# Patient Record
Sex: Male | Born: 1960 | Hispanic: Yes | Marital: Married | Smoking: Current every day smoker
Health system: Southern US, Community
[De-identification: ages and names within clinical notes are randomized; demographics above are authoritative.]

## PROBLEM LIST (undated history)

## (undated) DIAGNOSIS — Z789 Other specified health status: Secondary | ICD-10-CM

## (undated) HISTORY — PX: NO PAST SURGERIES: SHX2092

---

## 2013-09-05 ENCOUNTER — Emergency Department: Payer: Self-pay | Admitting: Emergency Medicine

## 2013-12-31 ENCOUNTER — Emergency Department: Payer: Self-pay | Admitting: Emergency Medicine

## 2014-05-27 ENCOUNTER — Other Ambulatory Visit: Payer: Self-pay | Admitting: Orthopedic Surgery

## 2014-05-28 ENCOUNTER — Encounter (HOSPITAL_BASED_OUTPATIENT_CLINIC_OR_DEPARTMENT_OTHER): Payer: Self-pay | Admitting: *Deleted

## 2014-05-28 NOTE — H&P (Signed)
Scott Wu is an 54 y.o. male.    Chief Complaint: Left Knee Pain  HPI: Patient is here today for followup on left knee pain.  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  He was last seen on 04/01/14 at which time he received a cortisone injection.  Patient states the cortisone injection did help initially but over the first couple hours.  He did not receive any substantial relief from the injection.  He currently rates his pain as an 8/10.  He has noticed continued catching popping clicking in the knee.    Past Medical History  Diagnosis Date  . Medical history non-contributory     Past Surgical History  Procedure Laterality Date  . No past surgeries      History reviewed. No pertinent family history. Social History:  reports that he has been smoking.  He does not have any smokeless tobacco history on file. He reports that he drinks alcohol. He reports that he does not use illicit drugs.  Allergies: No Known Allergies  No prescriptions prior to admission    No results found for this or any previous visit (from the past 48 hour(s)). No results found.  Review of Systems  Constitutional: Negative.   HENT: Negative.   Eyes: Negative.   Respiratory: Negative.   Cardiovascular: Negative.   Gastrointestinal: Negative.   Genitourinary: Negative.   Musculoskeletal: Positive for joint pain.  Skin: Negative.   Neurological: Negative.   Endo/Heme/Allergies: Negative.   Psychiatric/Behavioral: Negative.     Height 5\' 7"  (1.702 m), weight 77.111 kg (170 lb). Physical Exam  Constitutional: He is oriented to person, place, and time. He appears well-developed and well-nourished.  HENT:  Head: Normocephalic and atraumatic.  Eyes: Pupils are equal, round, and reactive to light.  Neck: Normal range of motion. Neck supple.  Cardiovascular:  Intact distal pulses.   Respiratory: Effort normal.  Musculoskeletal:  he has a range from 0-120.  Patient continues to have tenderness over the medial lateral joint lines.  McMurray's test does cause obvious pain.  No effusion.  No erythema or warmth.  No pain with valgus or varus stress.  He is neurovascularly intact distally.  Neurological: He is alert and oriented to person, place, and time.  Skin: Skin is warm and dry.  Psychiatric: He has a normal mood and affect. His behavior is normal. Judgment and thought content normal.     Assessment/Plan Assess: Left knee pain with findings on MRI scan of chondromalacia, chronic strain or old partial tear of the ACL, and possible degenerative tear of the lateral meniscus.  Plan: Treatment options are discussed with the patient and Dr. Turner Danielsowan.  We would like to formally request permission for a left knee arthroscopy.  Patient is made aware the benefits risks and potential complications of surgery.  A posting slip is completed and given to our workers Investment banker, corporatecomp coordinator.  We anticipate seeing the patient back at the time of surgical intervention.  He is to return sooner if needed.  Call with any issues.  Patient is given a prescription of Norco 5/325 one tablet twice daily #60.  Scott Wu 05/28/2014, 4:09 PM

## 2014-05-28 NOTE — Progress Notes (Signed)
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

## 2014-05-30 ENCOUNTER — Encounter (HOSPITAL_BASED_OUTPATIENT_CLINIC_OR_DEPARTMENT_OTHER)
Admission: RE | Disposition: A | Discharge status: Home / Self Care | Payer: Self-pay | Source: Ambulatory Visit | Attending: Orthopedic Surgery

## 2014-05-30 ENCOUNTER — Ambulatory Visit (HOSPITAL_BASED_OUTPATIENT_CLINIC_OR_DEPARTMENT_OTHER)
Admission: RE | Admit: 2014-05-30 | Discharge: 2014-05-30 | Disposition: A | Discharge status: Home / Self Care | Payer: Worker's Compensation | Source: Ambulatory Visit | Attending: Orthopedic Surgery | Admitting: Orthopedic Surgery

## 2014-05-30 ENCOUNTER — Ambulatory Visit (HOSPITAL_BASED_OUTPATIENT_CLINIC_OR_DEPARTMENT_OTHER): Payer: Worker's Compensation | Admitting: Certified Registered"

## 2014-05-30 ENCOUNTER — Encounter (HOSPITAL_BASED_OUTPATIENT_CLINIC_OR_DEPARTMENT_OTHER): Payer: Self-pay | Admitting: Certified Registered"

## 2014-05-30 DIAGNOSIS — M2242 Chondromalacia patellae, left knee: Secondary | ICD-10-CM | POA: Diagnosis present

## 2014-05-30 DIAGNOSIS — F17210 Nicotine dependence, cigarettes, uncomplicated: Secondary | ICD-10-CM | POA: Diagnosis not present

## 2014-05-30 DIAGNOSIS — M6752 Plica syndrome, left knee: Secondary | ICD-10-CM | POA: Diagnosis not present

## 2014-05-30 DIAGNOSIS — M94262 Chondromalacia, left knee: Secondary | ICD-10-CM

## 2014-05-30 HISTORY — PX: KNEE ARTHROSCOPY: SHX127

## 2014-05-30 HISTORY — DX: Other specified health status: Z78.9

## 2014-05-30 LAB — POCT HEMOGLOBIN-HEMACUE: Hemoglobin: 15 g/dL (ref 13.0–17.0)

## 2014-05-30 SURGERY — ARTHROSCOPY, KNEE
Anesthesia: General | Site: Knee | Laterality: Left

## 2014-05-30 MED ORDER — DEXTROSE-NACL 5-0.45 % IV SOLN: INTRAVENOUS | Status: DC

## 2014-05-30 MED ORDER — GLYCOPYRROLATE 0.2 MG/ML IJ SOLN
INTRAMUSCULAR | Status: DC | PRN
  Administered 2014-05-30: 0.2 mg via INTRAVENOUS

## 2014-05-30 MED ORDER — LIDOCAINE HCL (CARDIAC) 20 MG/ML IV SOLN
INTRAVENOUS | Status: DC | PRN
  Administered 2014-05-30: 60 mg via INTRAVENOUS

## 2014-05-30 MED ORDER — CEFAZOLIN SODIUM-DEXTROSE 2-3 GM-% IV SOLR
2.0000 g | INTRAVENOUS | Status: AC
  Administered 2014-05-30: 2 g via INTRAVENOUS

## 2014-05-30 MED ORDER — FENTANYL CITRATE 0.05 MG/ML IJ SOLN
25.0000 ug | INTRAMUSCULAR | Status: DC | PRN
  Administered 2014-05-30: 50 ug via INTRAVENOUS
  Administered 2014-05-30: 25 ug via INTRAVENOUS

## 2014-05-30 MED ORDER — FENTANYL CITRATE 0.05 MG/ML IJ SOLN
INTRAMUSCULAR | Status: DC | PRN
  Administered 2014-05-30 (×2): 50 ug via INTRAVENOUS

## 2014-05-30 MED ORDER — PROMETHAZINE HCL 25 MG/ML IJ SOLN: 6.2500 mg | INTRAMUSCULAR | Status: DC | PRN

## 2014-05-30 MED ORDER — CEFAZOLIN SODIUM-DEXTROSE 2-3 GM-% IV SOLR
INTRAVENOUS | Status: AC
  Filled 2014-05-30: qty 50

## 2014-05-30 MED ORDER — MIDAZOLAM HCL 2 MG/2ML IJ SOLN
INTRAMUSCULAR | Status: AC
Start: 2014-05-30 — End: 2014-05-30
  Filled 2014-05-30: qty 2

## 2014-05-30 MED ORDER — DEXAMETHASONE SODIUM PHOSPHATE 10 MG/ML IJ SOLN
INTRAMUSCULAR | Status: DC | PRN
  Administered 2014-05-30: 10 mg via INTRAVENOUS

## 2014-05-30 MED ORDER — SODIUM CHLORIDE 0.9 % IR SOLN
Status: DC | PRN
  Administered 2014-05-30: 3000 mL

## 2014-05-30 MED ORDER — FENTANYL CITRATE 0.05 MG/ML IJ SOLN: 50.0000 ug | INTRAMUSCULAR | Status: DC | PRN

## 2014-05-30 MED ORDER — CHLORHEXIDINE GLUCONATE 4 % EX LIQD
60.0000 mL | Freq: Once | CUTANEOUS | Status: DC
Start: 2014-05-30 — End: 2014-05-30

## 2014-05-30 MED ORDER — FENTANYL CITRATE 0.05 MG/ML IJ SOLN
INTRAMUSCULAR | Status: AC
  Filled 2014-05-30: qty 2

## 2014-05-30 MED ORDER — PROPOFOL 10 MG/ML IV BOLUS
INTRAVENOUS | Status: DC | PRN
  Administered 2014-05-30: 200 mg via INTRAVENOUS

## 2014-05-30 MED ORDER — KETOROLAC TROMETHAMINE 30 MG/ML IJ SOLN: 30.0000 mg | Freq: Once | INTRAMUSCULAR | Status: DC | PRN

## 2014-05-30 MED ORDER — LACTATED RINGERS IV SOLN
INTRAVENOUS | Status: DC
  Administered 2014-05-30 (×2): via INTRAVENOUS

## 2014-05-30 MED ORDER — HYDROCODONE-ACETAMINOPHEN 5-325 MG PO TABS: 1.0000 | ORAL_TABLET | Freq: Four times a day (QID) | ORAL | Status: AC | PRN

## 2014-05-30 MED ORDER — MIDAZOLAM HCL 5 MG/5ML IJ SOLN
INTRAMUSCULAR | Status: DC | PRN
  Administered 2014-05-30: 2 mg via INTRAVENOUS

## 2014-05-30 MED ORDER — ONDANSETRON HCL 4 MG/2ML IJ SOLN
INTRAMUSCULAR | Status: DC | PRN
  Administered 2014-05-30: 4 mg via INTRAVENOUS

## 2014-05-30 MED ORDER — MIDAZOLAM HCL 2 MG/2ML IJ SOLN: 1.0000 mg | INTRAMUSCULAR | Status: DC | PRN

## 2014-05-30 MED ORDER — FENTANYL CITRATE 0.05 MG/ML IJ SOLN
INTRAMUSCULAR | Status: AC
  Filled 2014-05-30: qty 4

## 2014-05-30 MED ORDER — EPINEPHRINE HCL 1 MG/ML IJ SOLN
INTRAMUSCULAR | Status: DC | PRN
  Administered 2014-05-30: 1 mg

## 2014-05-30 MED ORDER — BUPIVACAINE-EPINEPHRINE (PF) 0.5% -1:200000 IJ SOLN
INTRAMUSCULAR | Status: AC
  Filled 2014-05-30: qty 30

## 2014-05-30 MED ORDER — BUPIVACAINE-EPINEPHRINE 0.5% -1:200000 IJ SOLN
INTRAMUSCULAR | Status: DC | PRN
  Administered 2014-05-30: 20 mL

## 2014-05-30 SURGICAL SUPPLY — 47 items
BANDAGE ELASTIC 6 VELCRO ST LF (GAUZE/BANDAGES/DRESSINGS) ×3 IMPLANT
BLADE 4.2CUDA (BLADE) IMPLANT
BLADE CUTTER GATOR 3.5 (BLADE) ×3 IMPLANT
BLADE GREAT WHITE 4.2 (BLADE) IMPLANT
BLADE GREAT WHITE 4.2MM (BLADE)
BNDG COHESIVE 6X5 TAN STRL LF (GAUZE/BANDAGES/DRESSINGS) ×3 IMPLANT
DRAPE ARTHROSCOPY W/POUCH 114 (DRAPES) ×3 IMPLANT
DURAPREP 26ML APPLICATOR (WOUND CARE) ×3 IMPLANT
ELECT MENISCUS 165MM 90D (ELECTRODE) IMPLANT
ELECT REM PT RETURN 9FT ADLT (ELECTROSURGICAL)
ELECTRODE REM PT RTRN 9FT ADLT (ELECTROSURGICAL) IMPLANT
GAUZE SPONGE 4X4 12PLY STRL (GAUZE/BANDAGES/DRESSINGS) ×3 IMPLANT
GAUZE XEROFORM 1X8 LF (GAUZE/BANDAGES/DRESSINGS) ×3 IMPLANT
GLOVE BIO SURGEON STRL SZ 6.5 (GLOVE) ×2 IMPLANT
GLOVE BIO SURGEON STRL SZ7.5 (GLOVE) ×3 IMPLANT
GLOVE BIO SURGEON STRL SZ8.5 (GLOVE) ×3 IMPLANT
GLOVE BIO SURGEONS STRL SZ 6.5 (GLOVE) ×1
GLOVE BIOGEL PI IND STRL 6.5 (GLOVE) ×1 IMPLANT
GLOVE BIOGEL PI IND STRL 7.0 (GLOVE) ×1 IMPLANT
GLOVE BIOGEL PI IND STRL 8 (GLOVE) ×1 IMPLANT
GLOVE BIOGEL PI IND STRL 9 (GLOVE) ×1 IMPLANT
GLOVE BIOGEL PI INDICATOR 6.5 (GLOVE) ×2
GLOVE BIOGEL PI INDICATOR 7.0 (GLOVE) ×2
GLOVE BIOGEL PI INDICATOR 8 (GLOVE) ×2
GLOVE BIOGEL PI INDICATOR 9 (GLOVE) ×2
GLOVE ECLIPSE 6.5 STRL STRAW (GLOVE) ×3 IMPLANT
GOWN STRL REUS W/ TWL LRG LVL3 (GOWN DISPOSABLE) ×3 IMPLANT
GOWN STRL REUS W/ TWL XL LVL3 (GOWN DISPOSABLE) ×2 IMPLANT
GOWN STRL REUS W/TWL LRG LVL3 (GOWN DISPOSABLE) ×6
GOWN STRL REUS W/TWL XL LVL3 (GOWN DISPOSABLE) ×4
IV NS IRRIG 3000ML ARTHROMATIC (IV SOLUTION) ×3 IMPLANT
KNEE WRAP E Z 3 GEL PACK (MISCELLANEOUS) ×3 IMPLANT
MANIFOLD NEPTUNE II (INSTRUMENTS) ×3 IMPLANT
NDL SAFETY ECLIPSE 18X1.5 (NEEDLE) IMPLANT
NEEDLE HYPO 18GX1.5 SHARP (NEEDLE)
PACK ARTHROSCOPY DSU (CUSTOM PROCEDURE TRAY) ×3 IMPLANT
PACK BASIN DAY SURGERY FS (CUSTOM PROCEDURE TRAY) ×3 IMPLANT
PAD ALCOHOL SWAB (MISCELLANEOUS) ×3 IMPLANT
PENCIL BUTTON HOLSTER BLD 10FT (ELECTRODE) IMPLANT
SET ARTHROSCOPY TUBING (MISCELLANEOUS) ×2
SET ARTHROSCOPY TUBING LN (MISCELLANEOUS) ×1 IMPLANT
SLEEVE SCD COMPRESS KNEE MED (MISCELLANEOUS) IMPLANT
SYR 3ML 18GX1 1/2 (SYRINGE) IMPLANT
SYR 5ML LL (SYRINGE) IMPLANT
TOWEL OR 17X24 6PK STRL BLUE (TOWEL DISPOSABLE) ×3 IMPLANT
WAND STAR VAC 90 (SURGICAL WAND) IMPLANT
WATER STERILE IRR 1000ML POUR (IV SOLUTION) ×3 IMPLANT

## 2014-05-30 NOTE — Anesthesia Procedure Notes (Signed)
Procedure Name: LMA Insertion Date/Time: 05/30/2014 11:44 AM Performed by: Lorrin Bodner Pre-anesthesia Checklist: Patient identified, Emergency Drugs available, Suction available and Patient being monitored Patient Re-evaluated:Patient Re-evaluated prior to inductionOxygen Delivery Method: Circle System Utilized Preoxygenation: Pre-oxygenation with 100% oxygen Intubation Type: IV induction Ventilation: Mask ventilation without difficulty LMA: LMA inserted LMA Size: 4.0 Number of attempts: 1 Airway Equipment and Method: Bite block Placement Confirmation: positive ETCO2 Tube secured with: Tape Dental Injury: Teeth and Oropharynx as per pre-operative assessment

## 2014-05-30 NOTE — Transfer of Care (Signed)
Immediate Anesthesia Transfer of Care Note  Patient: Scott Wu  Procedure(s) Performed: Procedure(s): LEFT KNEE ARTHROSCOPY WITH DEBRIDEMENT CHONDROMALACIA  AND EXCISION OF PLICA (Left)  Patient Location: PACU  Anesthesia Type:General  Level of Consciousness: sedated  Airway & Oxygen Therapy: Patient Spontanous Breathing and Patient connected to face mask oxygen  Post-op Assessment: Report given to RN and Post -op Vital signs reviewed and stable  Post vital signs: Reviewed and stable  Last Vitals:  Filed Vitals:   05/30/14 1030  BP: 123/69  Temp: 37 C  Resp: 16    Complications: No apparent anesthesia complications

## 2014-05-30 NOTE — Anesthesia Postprocedure Evaluation (Signed)
Anesthesia Post Note  Patient: Scott Wu  Procedure(s) Performed: Procedure(s) (LRB): LEFT KNEE ARTHROSCOPY WITH DEBRIDEMENT CHONDROMALACIA  AND EXCISION OF PLICA (Left)  Anesthesia type: general  Patient location: PACU  Post pain: Pain level controlled  Post assessment: Patient's Cardiovascular Status Stable  Last Vitals:  Filed Vitals:   05/30/14 1330  BP: 117/69  Pulse: 69  Temp:   Resp: 18    Post vital signs: Reviewed and stable  Level of consciousness: sedated  Complications: No apparent anesthesia complications

## 2014-05-30 NOTE — Discharge Instructions (Addendum)
Procedimiento artroscpico, rodilla (Arthroscopic Procedure, Knee) Una artroscopa podr encontrar el problema que existe en su rodilla. PROCEDIMIENTO La artroscopa es una tcnica quirrgica. Le ayudar al cirujano ortopedista a diagnosticar y tratar la lesin de la rodilla con precisin. El cirujano observa en el interior de la rodilla a travs de un pequeo dispositivo. Este instrumento es similar a un pequeo telescopio (del tamao de un lpiz). La artroscopia es menos extensa que la [place] abierta de rodilla. Puede esperarse una recuperacin ms rpida. Si sigue las indicaciones del profesional que lo asiste podr recuperarse rpida y completamente. Use las muletas, haga reposo, eleve la pierna, colquese hielo y haga los ejercicios que le han indicado. El [place] de recuperacin depende de varios factores. Entre estos factores se incluyen el tipo de lesin, la edad del paciente, el estado fsico, las enfermedades que padece y el cumplimiento de las indicaciones. La rodilla es la articulacin entre los huesos largos de la pierna (el fmur y la tibia). Existen [employer] capas de cartlago que cubren los extremos de [place]. Los extremos de los huesos son suaves y Adult nurse. Permiten que la rodilla se doble y se mueva suavemente. Dos meniscos ayudan a Software engineer y a Building services engineer rodilla. Los ligamentos cumplen la funcin de [employer]. Sostienen la articulacin de la rodilla. Los meniscos son almohadillas de TEFL teacher grueso, de forma semilunar que forman un borde en el interior de la articulacin y actan absorbiendo los impactos. Los msculos mueven la articulacin, ayudan a Furniture conservator/restorer rodilla y se tensan en la articulacin misma. Debido a esto, la fisioterapia para rehabilitar o reparar una rodilla lesionada requiere la reconstruccin y el fortalecimiento de los msculos. DESPUS DEL PROCEDIMIENTO  Despus del procedimiento lo llevarn a un rea de recuperacin hasta que  hayan desaparecido los [employer]. El profesional [employer] de la prueba con usted.  Utilice los medicamentos de venta libre o de prescripcin para Chief Technology Officer, Environmental health practitioner o la [place], segn se lo indique el profesional que lo asiste. SOLICITE ATENCIN MDICA SI:  Aumenta el sangrado (ms all de una pequea mancha) en el lugar de la incisin (herida).  Presenta enrojecimiento, hinchazn o aumento del Art therapist de la incisin (herida).  Aparece pus en la herida.  La temperatura oral se eleva sin motivo por encima de 38,9 C (102 F) o segn le indique el profesional que lo asiste.  Advierte un olor ftido que proviene de la herida o del vendaje.  Siente dolor intenso al Heritage manager movimiento con la rodilla. SOLICITE ATENCIN MDICA DE INMEDIATO SI:  Presenta una erupcin cutnea.  Siente dificultad para respirar.  Tiene algn problema de alergia. Document Released: 04/11/2005 Document Revised: 07/04/2011 [facility] Patient Information 2015 [place], [place]. This information is not intended to replace advice given to you by your health care provider.  Post Anesthesia Home Care Instructions  Activity: Get plenty of rest for the remainder of the day. A responsible adult should stay with you for 24 hours following the procedure.  For the next 24 hours, DO NOT: -Drive a car -Advertising copywriter -Drink alcoholic beverages -Take any medication unless instructed by your physician -Make any legal decisions or sign important papers.  Meals: Start with liquid foods such as gelatin or soup. Progress to regular foods as tolerated. Avoid greasy, spicy, heavy foods. If nausea and/or vomiting occur, drink only clear liquids until the nausea and/or vomiting subsides. Call your physician if vomiting continues.  Special Instructions/Symptoms:  Your throat may feel dry or sore from the anesthesia or the breathing tube placed in your throat during surgery.  If this causes discomfort, gargle with warm salt water. The discomfort should disappear within 24 hours. Make sure you discuss any questions you have with your health care provider.

## 2014-05-30 NOTE — Interval H&P Note (Signed)
History and Physical Interval Note:  05/30/2014 11:37 AM  [facility] MillingMor[facility]  has presented today for surgery, with the diagnosis of LEFT KNEE LATERAL MENISCAL TEAR/CHONDROMALACIA  The various methods of treatment have been discussed with the patient and family. After consideration of risks, benefits and other options for treatment, the patient has consented to  Procedure(s): LEFT KNEE ARTHROSCOPY  (Left) as a surgical intervention .  The patient's history has been reviewed, patient examined, no change in status, stable for surgery.  I have reviewed the patient's chart and labs.  Questions were answered to the patient's satisfaction.     Nestor LewandowskyOWAN,Aysia Lowder J

## 2014-05-30 NOTE — Anesthesia Preprocedure Evaluation (Signed)
Anesthesia Evaluation  Patient identified by MRN, date of birth, ID band Patient awake    Reviewed: Allergy & Precautions, NPO status , Patient's Chart, lab work & pertinent test results  Airway Mallampati: II  TM Distance: >3 FB Neck ROM: Full    Dental no notable dental hx.    Pulmonary Current Smoker,  breath sounds clear to auscultation  Pulmonary exam normal       Cardiovascular negative cardio ROS  Rhythm:Regular Rate:Normal     Neuro/Psych negative neurological ROS  negative psych ROS   GI/Hepatic negative GI ROS, Neg liver ROS,   Endo/Other  negative endocrine ROS  Renal/GU negative Renal ROS  negative genitourinary   Musculoskeletal negative musculoskeletal ROS (+)   Abdominal   Peds negative pediatric ROS (+)  Hematology negative hematology ROS (+)   Anesthesia Other Findings   Reproductive/Obstetrics negative OB ROS                             Anesthesia Physical Anesthesia Plan  ASA: II  Anesthesia Plan: General   Post-op Pain Management:    Induction: Intravenous  Airway Management Planned: LMA  Additional Equipment:   Intra-op Plan:   Post-operative Plan: Extubation in OR  Informed Consent: I have reviewed the patients History and Physical, chart, labs and discussed the procedure including the risks, benefits and alternatives for the proposed anesthesia with the patient or authorized representative who has indicated his/her understanding and acceptance.   Dental advisory given  Plan Discussed with: CRNA and Surgeon  Anesthesia Plan Comments:         Anesthesia Quick Evaluation  

## 2014-05-30 NOTE — Op Note (Signed)
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 Turner Danielsowan M.D.  Assist: Tomi LikensEric K. Gaylene BrooksPhillips PA-C  (present throughout entire procedure and necessary for timely completion of the procedure) Anes: General LMA  EBL: Minimal  Fluids: 800 cc   Indications: patient was injured at work last year has had a left knee pain MRI scan is consistent with chondromalacia patella and pain has persisted.. Pt has failed conservative treatment with anti-inflammatory medicines, physical therapy, and modified activites but did get good temporarily from an intra-articular cortisone injection. Pain has recurred and patient desires elective arthroscopic evaluation and treatment of knee. Risks and benefits of surgery have been discussed and questions answered.  Procedure: Patient identified by arm band and taken to the operating room at the day surgery Center. The appropriate anesthetic monitors were attached, and General LMA anesthesia was induced without difficulty. Lateral post was applied to the table and the lower extremity was prepped and draped in usual sterile fashion from the ankle to the midthigh. Time out procedure was performed. We began the operation by making standard inferior lateral and inferior medial peripatellar portals with a #11 blade allowing introduction of the arthroscope through the inferior lateral portal and the out flow to the inferior medial portal. Pump pressure was set at 100 mmHg and diagnostic arthroscopy  revealed focal grade 4 chondromalacia with flap tears the lateral facet of the patella that was debrided back to a stable margin with a 3.5 mm Gator sucker shaver we used a supplemental lateral parapatellar portal to complete the debridement. We then perform  microfracture with an 18-gauge spinal needle placing 6 holes into the subchondral bone of the lateral facet of the patella. The articular and meniscal cartilages of the medial and lateral compartment had minimal degenerative changes or lightly debrided the anterior cruciate ligament and PCL are intact the gutters were cleared medially and laterally. There are some small bits of articular cartilage floating in the joint fluid there were irrigated out during the procedure.. The knee was irrigated out normal saline solution. A dressing of xerofoam 4 x 4 dressing sponges, web roll and an Ace wrap was applied. The patient was awakened extubated and taken to the recovery without difficulty.    Signed: Nestor LewandowskyFrank J Lavere Stork, MD

## 2014-06-02 ENCOUNTER — Encounter (HOSPITAL_BASED_OUTPATIENT_CLINIC_OR_DEPARTMENT_OTHER): Payer: Self-pay | Admitting: Orthopedic Surgery

## 2015-07-19 IMAGING — US US SCROTUM W/ DOPPLER COMPLETE
2 of 3 series · 14 of 25 positions shown · non-contrast
Comparison: None.

CLINICAL DATA: Left testicle pain.

EXAM:
SCROTAL ULTRASOUND
DOPPLER ULTRASOUND OF THE TESTICLES
TECHNIQUE: Complete ultrasound examination of the testicles, epididymis, and
other scrotal structures was performed. Color and spectral Doppler
ultrasound were also utilized to evaluate blood flow to the
testicles.

[Series 1: us scrotum w/ doppler complete · 0.05mm/px · 13 of 55 slices shown (1 of 2)]
[im 1/55]
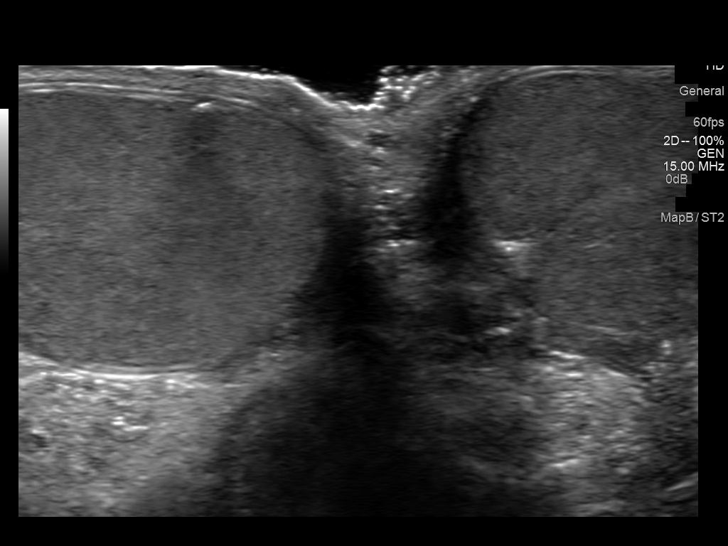
[im 5/55]
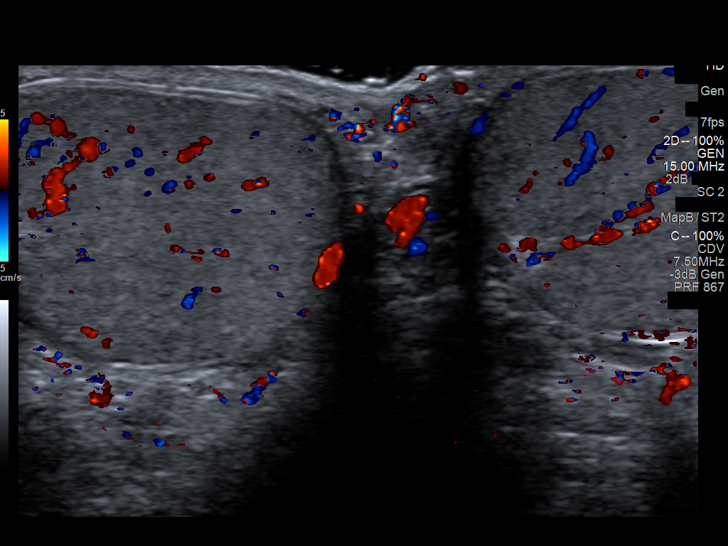
[im 10/55]
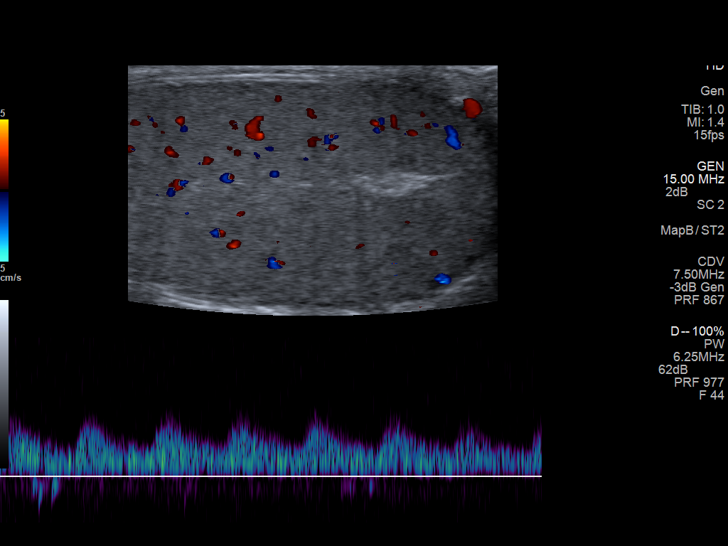
[im 15/55]
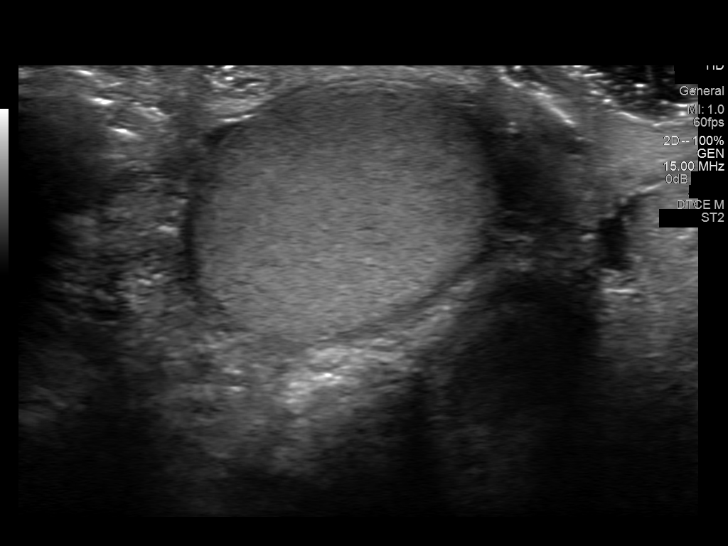
[im 20/55]
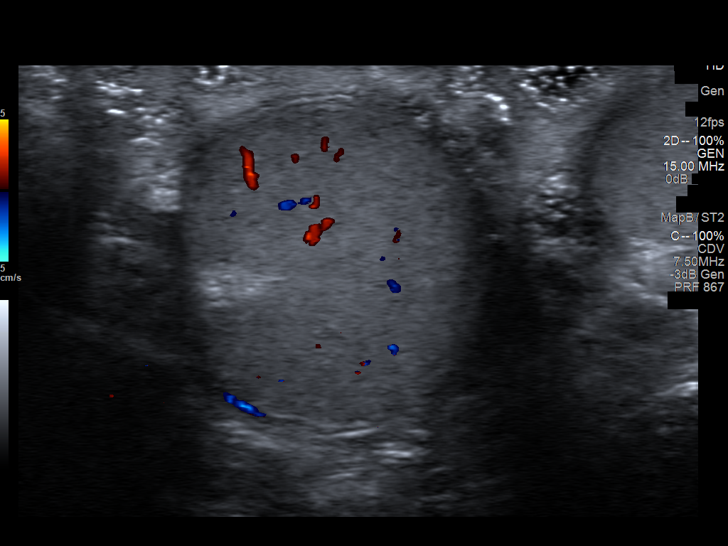
[im 23/55]
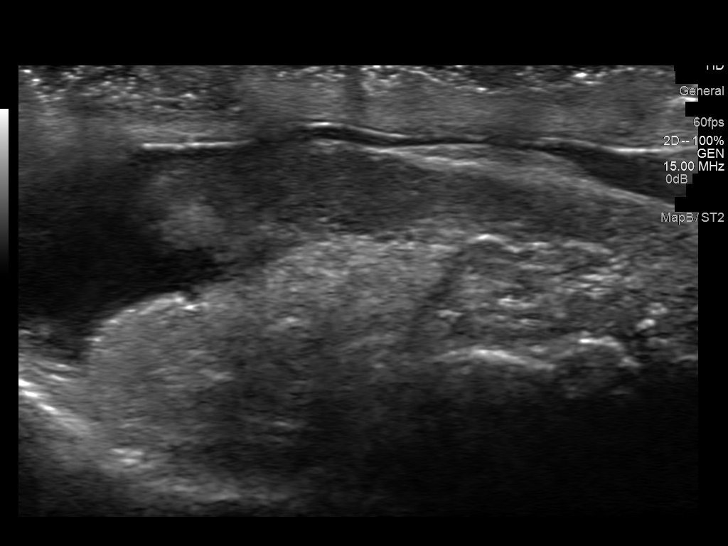
[im 28/55]
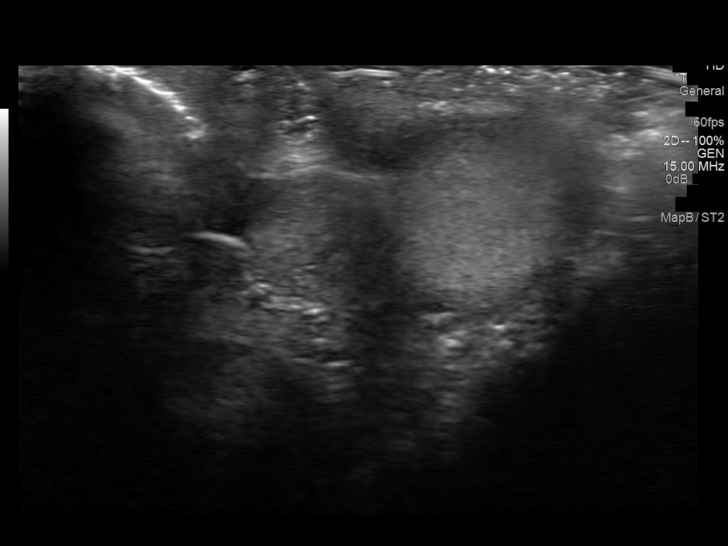
[im 32/55]
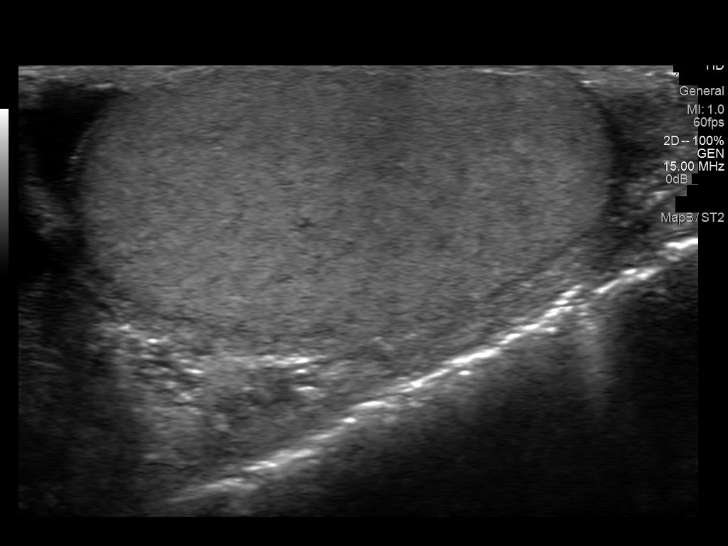
[im 37/55]
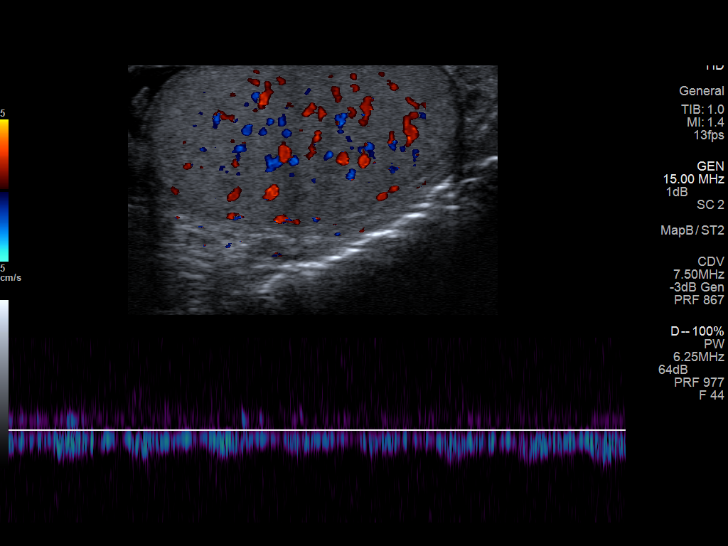
[im 40/55]
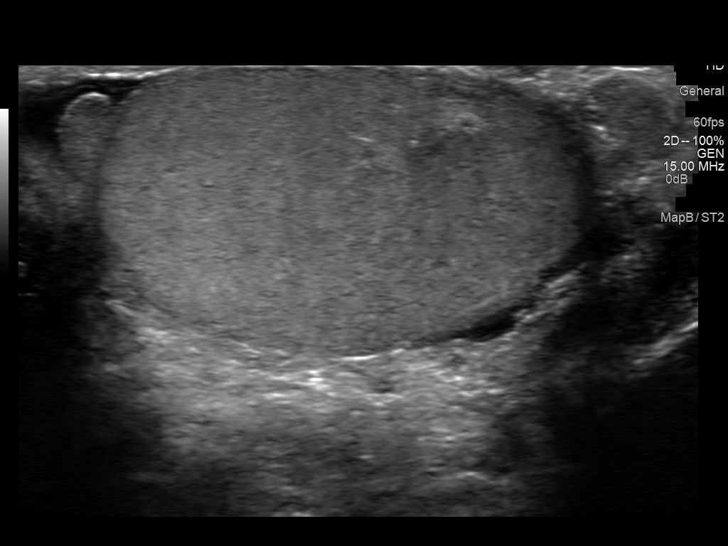
[im 45/55]
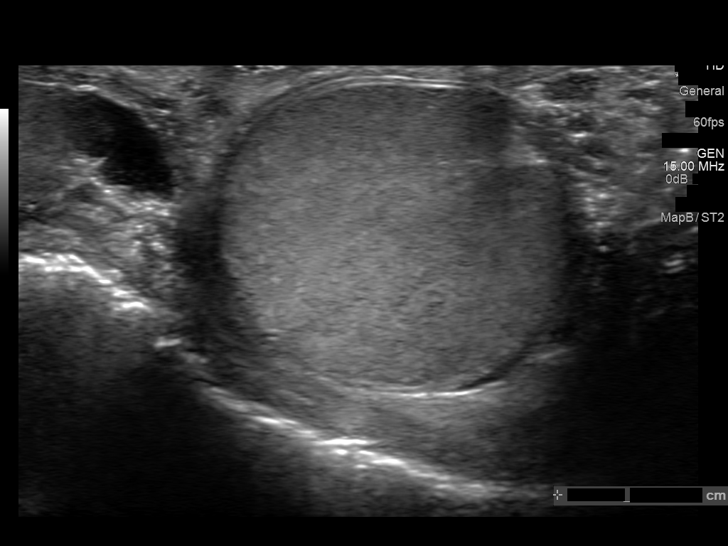
[im 50/55]
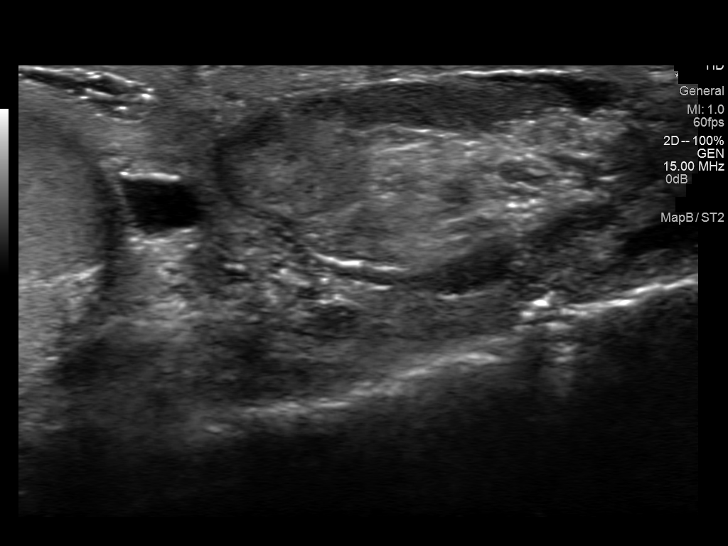
[im 55/55]
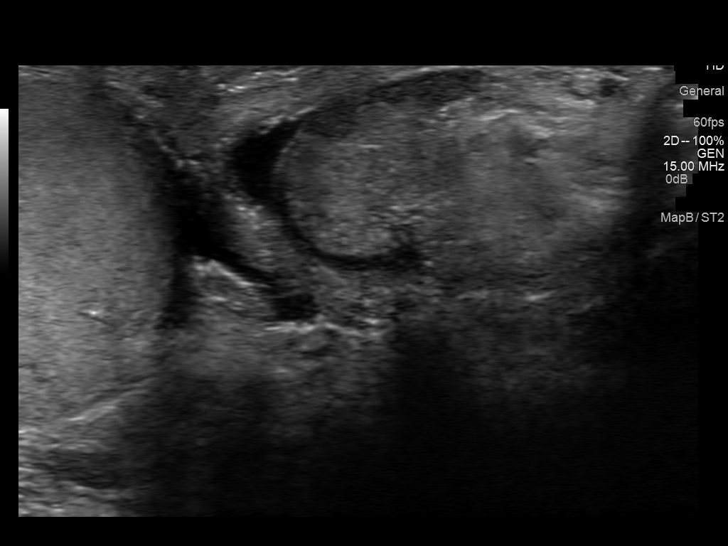

[Series 3: us scrotum w/ doppler complete · 0.05mm/px · 1 of 3 slices shown (2 of 2)]
[im 1/3]
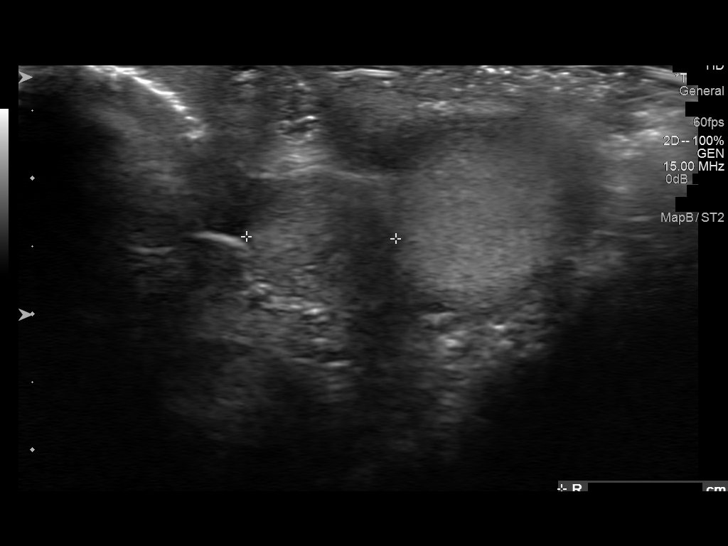

[14 of 25 positions shown; findings below may reference images not displayed]

FINDINGS: Right testicle

Measurements: 4.4 x 2.2 x 2.5 cm. No mass or microlithiasis
visualized.

Left testicle

Measurements: 4.2 x 2.4 x 2.6 cm. No mass or microlithiasis
visualized.

Right epididymis:  Normal.

Left epididymis:  2 mm epididymal cyst.  Otherwise normal.

Hydrocele:  Small bilateral hydroceles.

Varicocele:  None visualized.

Pulsed Doppler interrogation of both testes demonstrates low
resistance arterial and venous waveforms bilaterally.
IMPRESSION: No significant abnormality.  Small bilateral hydroceles.
